# Patient Record
Sex: Male | Born: 1965 | Race: White | Hispanic: No | Marital: Married | Smoking: Former smoker
Health system: Southern US, Community
[De-identification: ages and names within clinical notes are randomized; demographics above are authoritative.]

## PROBLEM LIST (undated history)

## (undated) DIAGNOSIS — K5090 Crohn's disease, unspecified, without complications: Secondary | ICD-10-CM

## (undated) HISTORY — PX: BOWEL RESECTION: SHX1257

## (undated) HISTORY — PX: APPENDECTOMY: SHX54

---

## 1998-01-05 ENCOUNTER — Ambulatory Visit (HOSPITAL_COMMUNITY): Admission: RE | Admit: 1998-01-05 | Discharge: 1998-01-05 | Payer: Self-pay | Admitting: Gastroenterology

## 1998-06-14 ENCOUNTER — Inpatient Hospital Stay (HOSPITAL_COMMUNITY): Admission: EM | Admit: 1998-06-14 | Discharge: 1998-06-24 | Payer: Self-pay | Admitting: Emergency Medicine

## 1998-06-15 ENCOUNTER — Encounter: Payer: Self-pay | Admitting: General Surgery

## 1998-06-17 ENCOUNTER — Encounter: Payer: Self-pay | Admitting: General Surgery

## 1998-06-18 ENCOUNTER — Encounter: Payer: Self-pay | Admitting: Surgery

## 1998-06-26 ENCOUNTER — Inpatient Hospital Stay (HOSPITAL_COMMUNITY): Admission: RE | Admit: 1998-06-26 | Discharge: 1998-07-03 | Payer: Self-pay | Admitting: Surgery

## 1998-06-27 ENCOUNTER — Encounter: Payer: Self-pay | Admitting: Surgery

## 1998-07-02 ENCOUNTER — Encounter: Payer: Self-pay | Admitting: Surgery

## 1999-08-12 ENCOUNTER — Ambulatory Visit (HOSPITAL_COMMUNITY): Admission: RE | Admit: 1999-08-12 | Discharge: 1999-08-12 | Payer: Self-pay | Admitting: Family Medicine

## 1999-08-12 ENCOUNTER — Encounter: Payer: Self-pay | Admitting: Family Medicine

## 2002-04-07 ENCOUNTER — Encounter: Admission: RE | Admit: 2002-04-07 | Discharge: 2002-04-07 | Payer: Self-pay | Admitting: Gastroenterology

## 2002-04-07 ENCOUNTER — Encounter: Payer: Self-pay | Admitting: Gastroenterology

## 2002-11-29 ENCOUNTER — Ambulatory Visit (HOSPITAL_COMMUNITY): Admission: RE | Admit: 2002-11-29 | Discharge: 2002-11-29 | Payer: Self-pay | Admitting: Gastroenterology

## 2005-11-19 ENCOUNTER — Encounter: Payer: Self-pay | Admitting: Emergency Medicine

## 2005-11-19 ENCOUNTER — Encounter: Payer: Self-pay | Admitting: Surgery

## 2011-05-22 ENCOUNTER — Other Ambulatory Visit: Payer: Self-pay | Admitting: Gastroenterology

## 2011-05-22 DIAGNOSIS — K5090 Crohn's disease, unspecified, without complications: Secondary | ICD-10-CM

## 2011-05-23 ENCOUNTER — Ambulatory Visit
Admission: RE | Admit: 2011-05-23 | Discharge: 2011-05-23 | Disposition: A | Discharge status: Home / Self Care | Payer: BC Managed Care – PPO | Source: Ambulatory Visit | Attending: Gastroenterology | Admitting: Gastroenterology

## 2011-05-23 DIAGNOSIS — K5090 Crohn's disease, unspecified, without complications: Secondary | ICD-10-CM

## 2011-05-23 MED ORDER — IOHEXOL 300 MG/ML  SOLN
100.0000 mL | Freq: Once | INTRAMUSCULAR | Status: AC | PRN
  Administered 2011-05-23: 100 mL via INTRAVENOUS

## 2013-02-03 ENCOUNTER — Ambulatory Visit
Admission: RE | Admit: 2013-02-03 | Discharge: 2013-02-03 | Disposition: A | Discharge status: Home / Self Care | Payer: BC Managed Care – PPO | Source: Ambulatory Visit | Attending: Gastroenterology | Admitting: Gastroenterology

## 2013-02-03 ENCOUNTER — Other Ambulatory Visit: Payer: Self-pay | Admitting: Gastroenterology

## 2013-02-03 DIAGNOSIS — M545 Low back pain: Secondary | ICD-10-CM

## 2013-02-03 DIAGNOSIS — R0789 Other chest pain: Secondary | ICD-10-CM

## 2013-02-23 ENCOUNTER — Other Ambulatory Visit: Payer: Self-pay | Admitting: Gastroenterology

## 2013-03-16 ENCOUNTER — Other Ambulatory Visit: Payer: Self-pay

## 2014-03-22 IMAGING — CR DG CHEST 2V
2 series · 2 of 2 positions shown · non-contrast
Comparison: None.

CLINICAL DATA: Chest wall pain, smoking history

CHEST - 2 VIEW

[w chest pa]
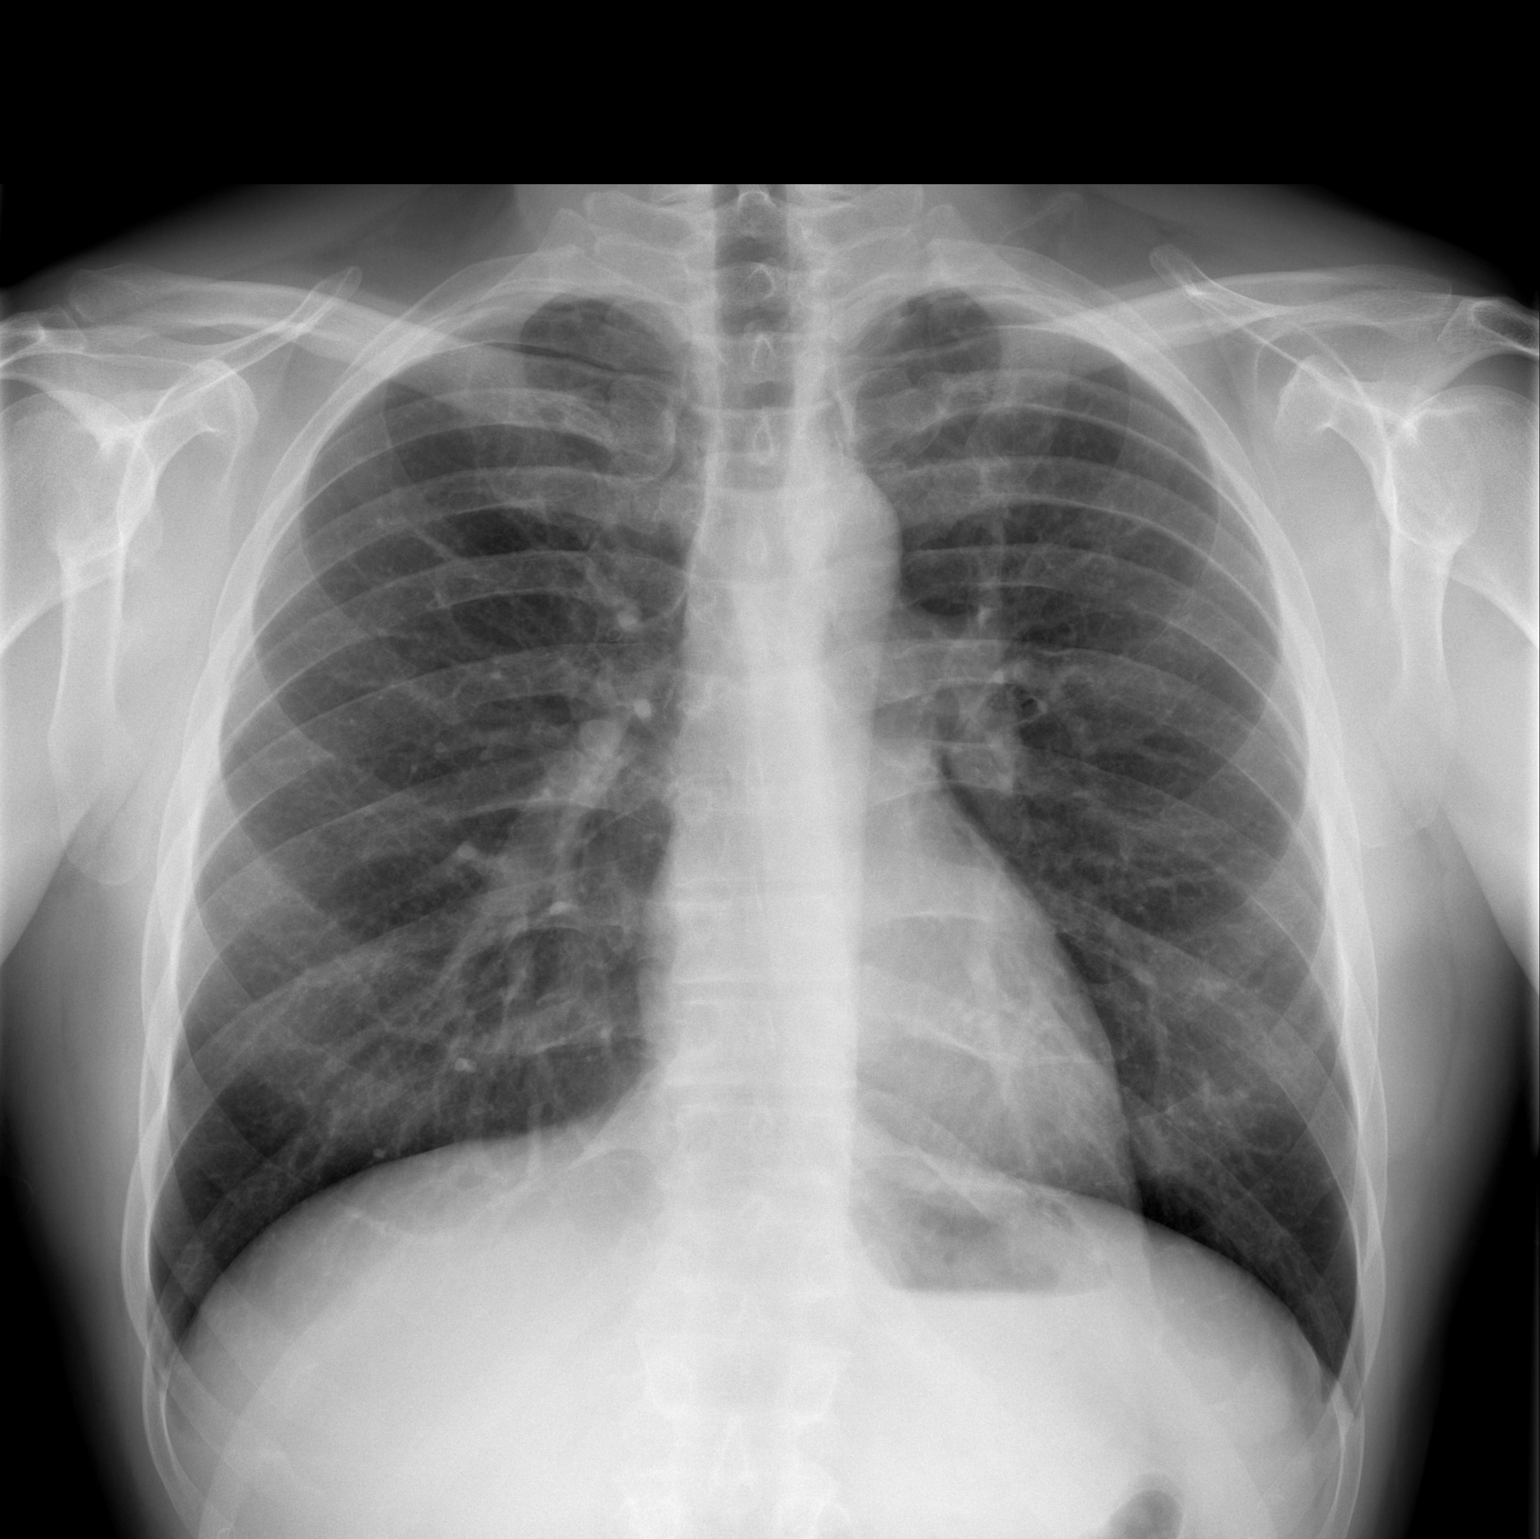

[w chest lat]
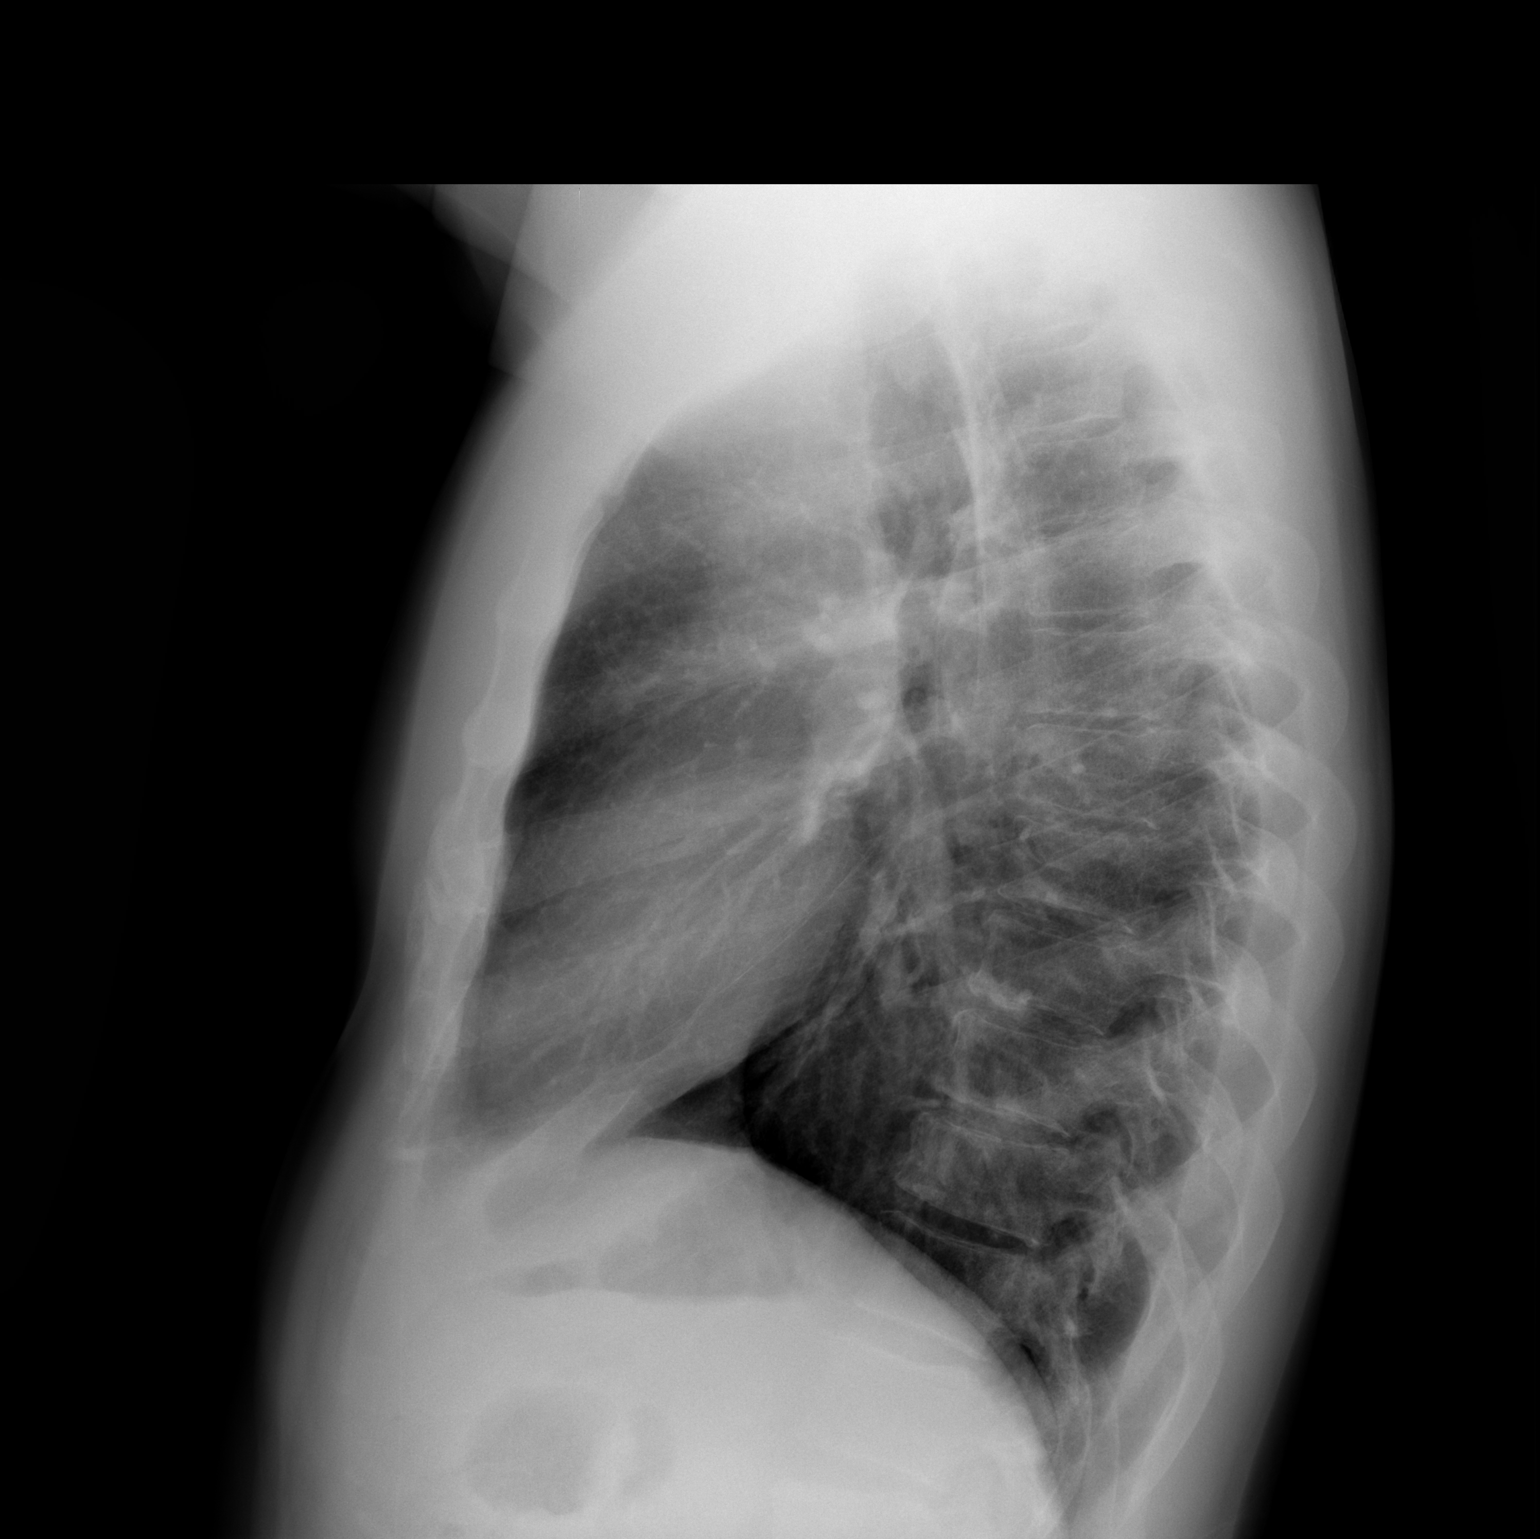

[2 of 2 positions shown; findings below may reference images not displayed]

FINDINGS: No active infiltrate or effusion is seen.  Mediastinal
contours appear normal.  The heart is within normal limits in size.
No bony abnormality is seen.
IMPRESSION: No active lung disease.

## 2014-10-24 ENCOUNTER — Telehealth: Payer: Self-pay | Admitting: Internal Medicine

## 2014-10-25 NOTE — Telephone Encounter (Signed)
Dr Rhea Belton has reviewed extensive records from Dr Randa Evens [facility]). Per Dr Rhea Belton, "Patient with long established care with Dr Randa Evens (16+ years management of crohns). If disease requires escalation of therapy that is beyond what Dr Randa Evens can offer, I would recommend referral to tertiary care GI clinic for management."

## 2014-12-15 ENCOUNTER — Encounter: Payer: Self-pay | Admitting: Family Medicine

## 2014-12-15 ENCOUNTER — Ambulatory Visit (INDEPENDENT_AMBULATORY_CARE_PROVIDER_SITE_OTHER): Payer: BLUE CROSS/BLUE SHIELD | Admitting: Family Medicine

## 2014-12-15 VITALS — BP 110/80 | HR 64 | Temp 98.2°F | Ht 72.0 in | Wt 194.0 lb

## 2014-12-15 DIAGNOSIS — Z23 Encounter for immunization: Secondary | ICD-10-CM

## 2014-12-15 DIAGNOSIS — K5090 Crohn's disease, unspecified, without complications: Secondary | ICD-10-CM | POA: Insufficient documentation

## 2014-12-15 DIAGNOSIS — F909 Attention-deficit hyperactivity disorder, unspecified type: Secondary | ICD-10-CM

## 2014-12-15 DIAGNOSIS — K50919 Crohn's disease, unspecified, with unspecified complications: Secondary | ICD-10-CM

## 2014-12-15 DIAGNOSIS — Z0000 Encounter for general adult medical examination without abnormal findings: Secondary | ICD-10-CM | POA: Diagnosis not present

## 2014-12-15 NOTE — Addendum Note (Signed)
Addended by: Kern Reap B on: 12/15/2014 03:54 PM   Modules accepted: Orders

## 2014-12-15 NOTE — Progress Notes (Signed)
Pre visit review using our clinic review tool, if applicable. No additional management support is needed unless otherwise documented below in the visit note. 

## 2014-12-15 NOTE — Progress Notes (Signed)
HPI:  Victor Lopez is here to establish care. Has not had a family doctor in the past. He sees his gastroenterologist for his Crohn's disease. He has always been healthy otherwise. He felt like it was time to get a family physician. He wants his Tdap today. He sees another doctor for his ADHD.  ROS negative for unless reported above: fevers, unintentional weight loss, hearing or vision loss, chest pain, palpitations, struggling to breath, hemoptysis, melena, hematochezia, hematuria, falls, loc, si, thoughts of self harm  No past medical history on file.  No past surgical history on file.  No family history on file.  History   Social History  . Marital Status: Married    Spouse Name: N/A  . Number of Children: N/A  . Years of Education: N/A   Social History Main Topics  . Smoking status: Former Games developer  . Smokeless tobacco: Not on file  . Alcohol Use: Not on file  . Drug Use: Not on file  . Sexual Activity: Not on file   Other Topics Concern  . None   Social History Narrative  . None     Current outpatient prescriptions:  .  amphetamine-dextroamphetamine (ADDERALL XR) 20 MG 24 hr capsule, Take 20 mg by mouth daily., Disp: , Rfl:  .  budesonide (ENTOCORT EC) 3 MG 24 hr capsule, 3 mg. 3 caps once daily, Disp: , Rfl:  .  mesalamine (PENTASA) 500 MG CR capsule, 500 mg. 2 caps twice daily, Disp: , Rfl:  .  Multiple Vitamin (MULTIVITAMIN) tablet, Take 1 tablet by mouth daily., Disp: , Rfl:  .  Omega 3 1000 MG CAPS, Take 1 capsule by mouth daily., Disp: , Rfl:   EXAM:  Filed Vitals:   12/15/14 1448  BP: 110/80  Pulse: 64  Temp: 98.2 F (36.8 C)    Body mass index is 26.31 kg/(m^2).  GENERAL: vitals reviewed and listed above, alert, oriented, appears well hydrated and in no acute distress  HEENT: atraumatic, conjunttiva clear, no obvious abnormalities on inspection of external nose and ears  NECK: no obvious masses on inspection  LUNGS: clear to auscultation  bilaterally, no wheezes, rales or rhonchi, good air movement  CV: HRRR, no peripheral edema  GU: declined  MS: moves all extremities without noticeable abnormality  PSYCH: pleasant and cooperative, no obvious depression or anxiety  ASSESSMENT AND PLAN:  Discussed the following assessment and plan:  Visit for preventive health examination - Plan: Lipid Panel, Hemoglobin A1c, HIV antibody (with reflex)  Crohn disease, unspecified complication  Attention deficit hyperactivity disorder (ADHD), unspecified ADHD type  -We reviewed the PMH, PSH, FH, SH, Meds and Allergies. -We provided refills for any medications we will prescribe as needed. -We addressed current concerns per orders and patient instructions. -We have asked for records for pertinent exams, studies, vaccines and notes from previous providers. -We have advised patient to follow up per instructions below. -reviewed leval a an b USPSTF recs -advised FASTING lab work for baseline diabetes and lipid screening -advised healthy lifestyle -Tdap -follow up yearly  -Patient advised to return or notify a doctor immediately if symptoms worsen or persist or new concerns arise.  Patient Instructions  BEFORE YOU LEAVE: -Tdap -schedule fasting lab appointment -follow up yearly  -We have ordered labs or studies at this visit. It can take up to 1-2 weeks for results and processing. We will contact you with instructions IF your results are abnormal. Normal results will be released to your [facility]. If  you have not heard from [place] or can not find your results in [facility] in 2 weeks please contact our office.  We recommend the following healthy lifestyle measures: - eat a healthy diet consisting of lots of vegetables, fruits, beans, nuts, seeds, healthy meats such as white chicken and fish   - avoid sweets, starches, fried foods, fast food, processed foods, sodas, red meet and other fattening foods.  - get a least 150 minutes of aerobic  exercise per week.           Kriste Basque R.

## 2014-12-15 NOTE — Patient Instructions (Addendum)
BEFORE YOU LEAVE: -Tdap -schedule fasting lab appointment -follow up yearly  -We have ordered labs or studies at this visit. It can take up to 1-2 weeks for results and processing. We will contact you with instructions IF your results are abnormal. Normal results will be released to your [facility]. If you have not heard from [place] or can not find your results in [facility] in 2 weeks please contact our office.  We recommend the following healthy lifestyle measures: - eat a healthy diet consisting of lots of vegetables, fruits, beans, nuts, seeds, healthy meats such as white chicken and fish   - avoid sweets, starches, fried foods, fast food, processed foods, sodas, red meet and other fattening foods.  - get a least 150 minutes of aerobic exercise per week.

## 2014-12-22 ENCOUNTER — Other Ambulatory Visit (INDEPENDENT_AMBULATORY_CARE_PROVIDER_SITE_OTHER): Payer: BLUE CROSS/BLUE SHIELD

## 2014-12-22 DIAGNOSIS — Z0000 Encounter for general adult medical examination without abnormal findings: Secondary | ICD-10-CM

## 2014-12-22 LAB — LIPID PANEL
CHOL/HDL RATIO: 3
Cholesterol: 160 mg/dL (ref 0–200)
HDL: 51.2 mg/dL (ref >39.00)
LDL Cholesterol: 90 mg/dL (ref 0–99)
NONHDL: 108.8
TRIGLYCERIDES: 92 mg/dL (ref 0.0–149.0)
VLDL: 18.4 mg/dL (ref 0.0–40.0)

## 2014-12-22 LAB — HEMOGLOBIN A1C: HEMOGLOBIN A1C: 5.5 % (ref 4.6–6.5)

## 2014-12-23 LAB — HIV ANTIBODY (ROUTINE TESTING W REFLEX): HIV: NONREACTIVE

## 2015-01-08 ENCOUNTER — Encounter: Payer: BLUE CROSS/BLUE SHIELD | Admitting: Family Medicine

## 2016-05-29 ENCOUNTER — Telehealth: Payer: Self-pay | Admitting: Family Medicine

## 2016-05-29 NOTE — Telephone Encounter (Signed)
Pt is now 50 and would like to have PSA blood work done only. Can I sch?

## 2016-05-29 NOTE — Telephone Encounter (Signed)
Needs physical to discuss and review other recommended preventive care measures. Please help him to schedule. Thanks.

## 2016-05-29 NOTE — Telephone Encounter (Signed)
Pt has been sch for tommorrow °

## 2016-05-30 ENCOUNTER — Encounter: Payer: Self-pay | Admitting: Family Medicine

## 2016-05-30 ENCOUNTER — Ambulatory Visit (INDEPENDENT_AMBULATORY_CARE_PROVIDER_SITE_OTHER): Payer: BLUE CROSS/BLUE SHIELD | Admitting: Family Medicine

## 2016-05-30 VITALS — BP 102/72 | HR 68 | Temp 98.3°F | Ht 72.0 in | Wt 211.0 lb

## 2016-05-30 DIAGNOSIS — F909 Attention-deficit hyperactivity disorder, unspecified type: Secondary | ICD-10-CM | POA: Diagnosis not present

## 2016-05-30 DIAGNOSIS — R351 Nocturia: Secondary | ICD-10-CM

## 2016-05-30 DIAGNOSIS — R3911 Hesitancy of micturition: Secondary | ICD-10-CM | POA: Diagnosis not present

## 2016-05-30 DIAGNOSIS — K50919 Crohn's disease, unspecified, with unspecified complications: Secondary | ICD-10-CM

## 2016-05-30 DIAGNOSIS — Z0000 Encounter for general adult medical examination without abnormal findings: Secondary | ICD-10-CM

## 2016-05-30 LAB — LIPID PANEL
CHOLESTEROL: 162 mg/dL (ref 0–200)
HDL: 49.6 mg/dL (ref >39.00)
LDL CALC: 80 mg/dL (ref 0–99)
NonHDL: 112.08
TRIGLYCERIDES: 162 mg/dL — AB (ref 0.0–149.0)
Total CHOL/HDL Ratio: 3
VLDL: 32.4 mg/dL (ref 0.0–40.0)

## 2016-05-30 LAB — BASIC METABOLIC PANEL
BUN: 13 mg/dL (ref 6–23)
CALCIUM: 9.9 mg/dL (ref 8.4–10.5)
CHLORIDE: 101 meq/L (ref 96–112)
CO2: 29 meq/L (ref 19–32)
Creatinine, Ser: 0.93 mg/dL (ref 0.40–1.50)
GFR: 91.17 mL/min (ref >60.00)
GLUCOSE: 89 mg/dL (ref 70–99)
Potassium: 4.6 mEq/L (ref 3.5–5.1)
SODIUM: 139 meq/L (ref 135–145)

## 2016-05-30 LAB — POCT URINALYSIS DIPSTICK
Bilirubin, UA: NEGATIVE
Blood, UA: NEGATIVE
GLUCOSE UA: NEGATIVE
Ketones, UA: NEGATIVE
LEUKOCYTES UA: NEGATIVE
NITRITE UA: NEGATIVE
Protein, UA: NEGATIVE
SPEC GRAV UA: 1.02
UROBILINOGEN UA: 0.2
pH, UA: 6

## 2016-05-30 LAB — PSA: PSA: 1.31 ng/mL (ref 0.10–4.00)

## 2016-05-30 LAB — HEMOGLOBIN A1C: Hgb A1c MFr Bld: 5.4 % (ref 4.6–6.5)

## 2016-05-30 NOTE — Patient Instructions (Signed)
BEFORE YOU LEAVE: -follow up: yearly and as needed -labs  -We placed a referral for you as discussed to the urologist. It usually takes about 1-2 weeks to process and schedule this referral. If you have not heard from [place] regarding this appointment in 2 weeks please contact our office.  We have ordered labs or studies at this visit. It can take up to 1-2 weeks for results and processing. IF results require follow up or explanation, we will call you with instructions. Clinically stable results will be released to your [facility]. If you have not heard from [place] or cannot find your results in [facility] in 2 weeks please contact our office at [phone].  If you are not yet signed up for [facility], please consider signing up.   We recommend the following healthy lifestyle for LIFE: 1) Small portions.   Tip: eat off of a salad plate instead of a dinner plate.  Tip: if you need more or a snack choose fruits, veggies and/or a handful of nuts or seeds.  2) Eat a healthy clean diet.  * Tip: Avoid (less then 1 serving per week): processed foods, sweets, sweetened drinks, white starches (rice, flour, bread, potatoes, pasta, etc), red meat, fast foods, butter  *Tip: CHOOSE instead   * 5-9 servings per day of fresh or frozen fruits and vegetables (but not corn, potatoes, bananas, canned or dried fruit)   *nuts and seeds, beans   *olives and olive oil   *small portions of lean meats such as fish and white chicken    *small portions of whole grains  3)Get at least 150 minutes of sweaty aerobic exercise per week.  4)Reduce stress - consider counseling, meditation and relaxation to balance other aspects of your life.  If you are not yet signed up for [facility], please SIGN UP TODAY. We now offer online scheduling, same day appointments and extended hours. WHEN YOU DON'T FEEL YOUR BEST.Marland KitchenMarland KitchenWE ARE HERE TO HELP.

## 2016-05-30 NOTE — Progress Notes (Signed)
Pre visit review using our clinic review tool, if applicable. No additional management support is needed unless otherwise documented below in the visit note. 

## 2016-05-30 NOTE — Progress Notes (Signed)
HPI:  Here for CPE:  -Concerns and/or follow up today: Sees specialists for his ADHD and his crohn's disease. In a study, inject several colonoscopies per year. He wants to do a PSA, as a friend of his was diagnosed prostate cancer age 50. He is very worried about this. He has had rapidly progressive changes in his urinary system over the last 3 years. He reports he has developed some urgency and hesitancy along with nocturia 3-5 times per night. He is wondering if he should see a urologist also. Denies hematuria, unexplained weight loss, fevers, pain with urination. -Diet: variety of foods, balance and well rounded, larger portion sizes  -Exercise: no regular exercise  -Diabetes and Dyslipidemia Screening: wants to check labs today as he has gained weight due to poor lifestyle  -Hx of HTN: no  -Vaccines: Declined, he is not a fan of medications are vaccines  -sexual activity: yes, male partner, no new partners  -wants STI testing, Hep C screening (if born 17-1965): no  -FH colon or prstate ca: see FH Last colon cancer screening: sees gi at [facility], frequent, multiple this year Last prostate ca screening: not done  -Alcohol, Tobacco, drug use: see social history  Review of Systems - no fevers, unintentional weight loss, vision loss, hearing loss, chest pain, sob, hemoptysis, melena, hematochezia, hematuria, genital discharge, changing or concerning skin lesions, bleeding, bruising, loc, thoughts of self harm or SI  No past medical history on file.  No past surgical history on file.  No family history on file.  Social History   Social History  . Marital status: Married    Spouse name: N/A  . Number of children: N/A  . Years of education: N/A   Social History Main Topics  . Smoking status: Former Games developer  . Smokeless tobacco: None  . Alcohol use None  . Drug use: Unknown  . Sexual activity: Not Asked   Other Topics Concern  . None   Social History Narrative  .  None     Current Outpatient Prescriptions:  .  amphetamine-dextroamphetamine (ADDERALL XR) 20 MG 24 hr capsule, Take 20 mg by mouth daily., Disp: , Rfl:  .  Multiple Vitamin (MULTIVITAMIN) tablet, Take 1 tablet by mouth daily., Disp: , Rfl:  .  Omega 3 1000 MG CAPS, Take 1 capsule by mouth daily., Disp: , Rfl:  .  Vedolizumab (ENTYVIO IV), Inject into the vein., Disp: , Rfl:   EXAM:  Vitals:   05/30/16 1138  BP: 102/72  Pulse: 68  Temp: 98.3 F (36.8 C)  TempSrc: Oral  Weight: 211 lb (95.7 kg)  Height: 6' (1.829 m)    Estimated body mass index is 28.62 kg/m as calculated from the following:   Height as of this encounter: 6' (1.829 m).   Weight as of this encounter: 211 lb (95.7 kg).  GENERAL: vitals reviewed and listed below, alert, oriented, appears well hydrated and in no acute distress  HEENT: head atraumatic, PERRLA, normal appearance of eyes, ears, nose and mouth. moist mucus membranes.  NECK: supple, no masses or lymphadenopathy  LUNGS: clear to auscultation bilaterally, no rales, rhonchi or wheeze  CV: HRRR, no peripheral edema or cyanosis, normal pedal pulses  ABDOMEN: bowel sounds normal, soft, non tender to palpation, no masses, no rebound or guarding  GU: declined, prefers to see urologist  SKIN: no rash or abnormal lesions - sees dermatologist  MS: normal gait, moves all extremities normally  NEURO: normal gait, speech and thought processing grossly  intact, muscle tone grossly intact throughout  PSYCH: normal affect, pleasant and cooperative  ASSESSMENT AND PLAN:  Discussed the following assessment and plan:  Encounter for preventive health examination - Plan: Lipid Panel, Hemoglobin A1c, Basic metabolic panel  Crohn's disease with complication, unspecified gastrointestinal tract location [facility](HCC)  Attention deficit hyperactivity disorder (ADHD), unspecified ADHD type  Nocturia - Plan: Ambulatory referral to Urology  Urinary hesitancy - Plan:  PSA, POC Urinalysis Dipstick, Ambulatory referral to Urology   -discussed risks/limitations/benefits PSA testing and DRE - will check this and labs/urine today. He still wants to see urologist as well and referral placed. Deferred DRE.  -Discussed and advised all [place] preventive services health task force level A and B recommendations for age, sex and risks.  --Advised at least 150 minutes of exercise per week and a healthy diet with avoidance of (less then 1 serving per week) processed foods, white starches, red meat, fast foods and sweets and consisting of: * 5-9 servings of fresh fruits and vegetables (not corn or potatoes) *nuts and seeds, beans *olives and olive oil *lean meats such as fish and white chicken  *whole grains  -labs, studies and vaccines per orders this encounter   Patient advised to return to clinic immediately if symptoms worsen or persist or new concerns.  There are no Patient Instructions on file for this visit.  No Follow-up on file.   [name], [name], DO

## 2018-05-19 ENCOUNTER — Other Ambulatory Visit: Payer: Self-pay | Admitting: General Surgery

## 2018-05-19 DIAGNOSIS — R1032 Left lower quadrant pain: Principal | ICD-10-CM

## 2018-05-19 DIAGNOSIS — R1031 Right lower quadrant pain: Secondary | ICD-10-CM

## 2018-05-24 ENCOUNTER — Ambulatory Visit
Admission: RE | Admit: 2018-05-24 | Discharge: 2018-05-24 | Disposition: A | Discharge status: Home / Self Care | Payer: BLUE CROSS/BLUE SHIELD | Source: Ambulatory Visit | Attending: General Surgery | Admitting: General Surgery

## 2018-05-24 ENCOUNTER — Encounter: Payer: Self-pay | Admitting: Radiology

## 2018-05-24 DIAGNOSIS — R1032 Left lower quadrant pain: Principal | ICD-10-CM

## 2018-05-24 DIAGNOSIS — R1031 Right lower quadrant pain: Secondary | ICD-10-CM

## 2018-05-24 MED ORDER — IOPAMIDOL (ISOVUE-300) INJECTION 61%
100.0000 mL | Freq: Once | INTRAVENOUS | Status: AC | PRN
  Administered 2018-05-24: 100 mL via INTRAVENOUS

## 2023-09-15 ENCOUNTER — Other Ambulatory Visit: Payer: Self-pay | Admitting: Family Medicine

## 2023-09-15 DIAGNOSIS — T1490XA Injury, unspecified, initial encounter: Secondary | ICD-10-CM

## 2023-09-16 ENCOUNTER — Ambulatory Visit
Admission: RE | Admit: 2023-09-16 | Discharge: 2023-09-16 | Disposition: A | Discharge status: Home / Self Care | Payer: BLUE CROSS/BLUE SHIELD | Source: Ambulatory Visit | Attending: Family Medicine

## 2023-09-16 DIAGNOSIS — T1490XA Injury, unspecified, initial encounter: Secondary | ICD-10-CM

## 2023-09-24 ENCOUNTER — Other Ambulatory Visit: Payer: Self-pay

## 2023-09-24 ENCOUNTER — Encounter (HOSPITAL_COMMUNITY): Payer: Self-pay | Admitting: Orthopedic Surgery

## 2023-09-24 NOTE — Progress Notes (Signed)
 SDW CALL  Patient was given pre-op instructions over the phone. The opportunity was given for the patient to ask questions. No further questions asked. Patient verbalized understanding of instructions given.   PCP - Kathaleen Bury Cardiologist - denies Gastroenterologist - Jaci Carrel  PPM/ICD - denies Device Orders -  Rep Notified -   Chest x-ray - na EKG - na Stress Test - denies ECHO - denies Cardiac Cath - denies  Sleep Study - denies CPAP -   Fasting Blood Sugar - na Checks Blood Sugar ____ times a day  Blood Thinner Instructions:na Aspirin Instructions:na  ERAS Protcol -NO  PRE-SURGERY Ensure or G2-   COVID TEST- na   Anesthesia review: no  Patient denies shortness of breath, fever, cough and chest pain over the phone call    Surgical Instructions    Your procedure is scheduled on March 7  Report to [facility] Main Entrance "A" at 0530 A.M., then check in with the Admitting office.  Call this number if you have problems the morning of surgery:  [phone]    Remember:  Do not eat or drink anything after midnight the night before your surgery   Take these medicines the morning of surgery with A SIP OF WATER: NONE  As of today, STOP taking any Aspirin (unless otherwise instructed by your surgeon) Aleve, Naproxen, Ibuprofen, Motrin, Advil, Goody's, BC's, all herbal medications, fish oil, and all vitamins.  [place] is not responsible for any belongings or valuables. .   Do NOT Smoke (Tobacco/Vaping)  24 hours prior to your procedure  If you use a CPAP at night, you may bring your mask for your overnight stay.   Contacts, glasses, hearing aids, dentures or partials may not be worn into surgery, please bring cases for these belongings   Patients discharged the day of surgery will not be allowed to drive home, and someone needs to stay with them for 24 hours.    Special instructions:    Oral Hygiene is also important to reduce  your risk of infection.  Remember - BRUSH YOUR TEETH THE MORNING OF SURGERY WITH YOUR REGULAR TOOTHPASTE   Day of Surgery:  Take a shower the day of or night before with antibacterial soap. Wear Clean/Comfortable clothing the morning of surgery Do not apply any deodorants/lotions.   Do not wear jewelry or makeup Do not wear lotions, powders, perfumes/colognes, or deodorant. Do not shave 48 hours prior to surgery.  Men may shave face and neck. Do not bring valuables to the hospital. Do not wear nail polish, gel polish, artificial nails, or any other type of covering on natural nails (fingers and toes) If you have artificial nails or gel coating that need to be removed by a nail salon, please have this removed prior to surgery. Artificial nails or gel coating may interfere with anesthesia's ability to adequately monitor your vital signs. Remember to brush your teeth WITH YOUR REGULAR TOOTHPASTE.

## 2023-09-24 NOTE — Anesthesia Preprocedure Evaluation (Addendum)
 Anesthesia Evaluation  Patient identified by MRN, date of birth, ID band Patient awake    Reviewed: Allergy & Precautions, H&P , NPO status , Patient's Chart, lab work & pertinent test results  Airway Mallampati: III  TM Distance: >3 FB Neck ROM: Full    Dental no notable dental hx. (+) Teeth Intact, Dental Advisory Given   Pulmonary neg pulmonary ROS, former smoker   Pulmonary exam normal breath sounds clear to auscultation       Cardiovascular Exercise Tolerance: Good negative cardio ROS  Rhythm:Regular Rate:Normal     Neuro/Psych negative neurological ROS  negative psych ROS   GI/Hepatic negative GI ROS, Neg liver ROS,,,  Endo/Other  negative endocrine ROS    Renal/GU negative Renal ROS  negative genitourinary   Musculoskeletal   Abdominal   Peds  Hematology negative hematology ROS (+)   Anesthesia Other Findings   Reproductive/Obstetrics negative OB ROS                             Anesthesia Physical Anesthesia Plan  ASA: 2  Anesthesia Plan: General   Post-op Pain Management: Regional block* and Tylenol PO (pre-op)*   Induction: Intravenous  PONV Risk Score and Plan: 2 and Midazolam, Dexamethasone and Ondansetron  Airway Management Planned: LMA  Additional Equipment:   Intra-op Plan:   Post-operative Plan: Extubation in OR  Informed Consent: I have reviewed the patients History and Physical, chart, labs and discussed the procedure including the risks, benefits and alternatives for the proposed anesthesia with the patient or authorized representative who has indicated his/her understanding and acceptance.     Dental advisory given  Plan Discussed with: CRNA  Anesthesia Plan Comments:        Anesthesia Quick Evaluation

## 2023-09-25 ENCOUNTER — Encounter (HOSPITAL_COMMUNITY): Payer: Self-pay | Admitting: Orthopedic Surgery

## 2023-09-25 ENCOUNTER — Encounter (HOSPITAL_COMMUNITY)
Admission: RE | Disposition: A | Discharge status: Home / Self Care | Payer: Self-pay | Source: Home / Self Care | Attending: Orthopedic Surgery

## 2023-09-25 ENCOUNTER — Ambulatory Visit (HOSPITAL_COMMUNITY): Payer: Self-pay | Admitting: Anesthesiology

## 2023-09-25 ENCOUNTER — Other Ambulatory Visit: Payer: Self-pay

## 2023-09-25 ENCOUNTER — Ambulatory Visit (HOSPITAL_COMMUNITY)
Admission: RE | Admit: 2023-09-25 | Discharge: 2023-09-25 | Disposition: A | Discharge status: Home / Self Care | Attending: Orthopedic Surgery | Admitting: Orthopedic Surgery

## 2023-09-25 ENCOUNTER — Other Ambulatory Visit (HOSPITAL_COMMUNITY): Payer: Self-pay

## 2023-09-25 DIAGNOSIS — K5090 Crohn's disease, unspecified, without complications: Secondary | ICD-10-CM | POA: Diagnosis not present

## 2023-09-25 DIAGNOSIS — Z87891 Personal history of nicotine dependence: Secondary | ICD-10-CM | POA: Insufficient documentation

## 2023-09-25 DIAGNOSIS — Y9323 Activity, snow (alpine) (downhill) skiing, snow boarding, sledding, tobogganing and snow tubing: Secondary | ICD-10-CM | POA: Diagnosis not present

## 2023-09-25 DIAGNOSIS — S86012A Strain of left Achilles tendon, initial encounter: Secondary | ICD-10-CM | POA: Diagnosis present

## 2023-09-25 HISTORY — PX: ACHILLES TENDON SURGERY: SHX542

## 2023-09-25 HISTORY — DX: Crohn's disease, unspecified, without complications: K50.90

## 2023-09-25 LAB — CBC
HCT: 41.9 % (ref 39.0–52.0)
Hemoglobin: 14.4 g/dL (ref 13.0–17.0)
MCH: 30.9 pg (ref 26.0–34.0)
MCHC: 34.4 g/dL (ref 30.0–36.0)
MCV: 89.9 fL (ref 80.0–100.0)
Platelets: 220 10*3/uL (ref 150–400)
RBC: 4.66 MIL/uL (ref 4.22–5.81)
RDW: 13.2 % (ref 11.5–15.5)
WBC: 6.1 10*3/uL (ref 4.0–10.5)
nRBC: 0 % (ref 0.0–0.2)

## 2023-09-25 LAB — SURGICAL PCR SCREEN
MRSA, PCR: POSITIVE — AB
Staphylococcus aureus: POSITIVE — AB

## 2023-09-25 SURGERY — REPAIR, TENDON, ACHILLES
Anesthesia: General | Site: Ankle | Laterality: Left

## 2023-09-25 MED ORDER — ONDANSETRON HCL 4 MG/2ML IJ SOLN
INTRAMUSCULAR | Status: AC
  Filled 2023-09-25: qty 2

## 2023-09-25 MED ORDER — FENTANYL CITRATE (PF) 100 MCG/2ML IJ SOLN
INTRAMUSCULAR | Status: AC
  Filled 2023-09-25: qty 2

## 2023-09-25 MED ORDER — BUPIVACAINE-EPINEPHRINE (PF) 0.5% -1:200000 IJ SOLN
INTRAMUSCULAR | Status: DC | PRN
  Administered 2023-09-25: 15 mL via PERINEURAL
  Administered 2023-09-25: 20 mL via PERINEURAL

## 2023-09-25 MED ORDER — PROPOFOL 10 MG/ML IV BOLUS
INTRAVENOUS | Status: DC | PRN
  Administered 2023-09-25: 140 mg via INTRAVENOUS

## 2023-09-25 MED ORDER — ACETAMINOPHEN 10 MG/ML IV SOLN
INTRAVENOUS | Status: AC
  Filled 2023-09-25: qty 100

## 2023-09-25 MED ORDER — DEXAMETHASONE SODIUM PHOSPHATE 10 MG/ML IJ SOLN
INTRAMUSCULAR | Status: AC
  Filled 2023-09-25: qty 1

## 2023-09-25 MED ORDER — ROCURONIUM BROMIDE 10 MG/ML (PF) SYRINGE
PREFILLED_SYRINGE | INTRAVENOUS | Status: AC
  Filled 2023-09-25: qty 10

## 2023-09-25 MED ORDER — ROCURONIUM 10MG/ML (10ML) SYRINGE FOR MEDFUSION PUMP - OPTIME
INTRAVENOUS | Status: DC | PRN
  Administered 2023-09-25 (×2): 20 mg via INTRAVENOUS
  Administered 2023-09-25: 50 mg via INTRAVENOUS

## 2023-09-25 MED ORDER — VANCOMYCIN HCL IN DEXTROSE 1-5 GM/200ML-% IV SOLN
INTRAVENOUS | Status: AC
  Filled 2023-09-25: qty 200

## 2023-09-25 MED ORDER — ACETAMINOPHEN 500 MG PO TABS
500.0000 mg | ORAL_TABLET | Freq: Three times a day (TID) | ORAL | 0 refills | Status: AC | PRN
  Filled 2023-09-25: qty 60, 10d supply, fill #0

## 2023-09-25 MED ORDER — OXYCODONE HCL 5 MG PO TABS
5.0000 mg | ORAL_TABLET | Freq: Three times a day (TID) | ORAL | 0 refills | Status: AC | PRN
  Filled 2023-09-25: qty 30, 5d supply, fill #0

## 2023-09-25 MED ORDER — ACETAMINOPHEN 10 MG/ML IV SOLN
1000.0000 mg | Freq: Four times a day (QID) | INTRAVENOUS | Status: DC
  Administered 2023-09-25: 1000 mg via INTRAVENOUS

## 2023-09-25 MED ORDER — ONDANSETRON HCL 4 MG/2ML IJ SOLN
INTRAMUSCULAR | Status: DC | PRN
  Administered 2023-09-25: 4 mg via INTRAVENOUS

## 2023-09-25 MED ORDER — EPHEDRINE 5 MG/ML INJ
INTRAVENOUS | Status: AC
  Filled 2023-09-25: qty 5

## 2023-09-25 MED ORDER — DEXAMETHASONE SODIUM PHOSPHATE 10 MG/ML IJ SOLN
INTRAMUSCULAR | Status: DC | PRN
Start: 2023-09-25 — End: 2023-09-25
  Administered 2023-09-25: 8 mg via INTRAVENOUS

## 2023-09-25 MED ORDER — MUPIROCIN 2 % EX OINT: 1.0000 | TOPICAL_OINTMENT | Freq: Two times a day (BID) | CUTANEOUS | Status: DC

## 2023-09-25 MED ORDER — ONDANSETRON 4 MG PO TBDP
4.0000 mg | ORAL_TABLET | Freq: Three times a day (TID) | ORAL | 0 refills | Status: AC | PRN
  Filled 2023-09-25: qty 20, 7d supply, fill #0

## 2023-09-25 MED ORDER — CEFAZOLIN SODIUM-DEXTROSE 2-4 GM/100ML-% IV SOLN
2.0000 g | INTRAVENOUS | Status: AC
  Administered 2023-09-25: 2 g via INTRAVENOUS
  Filled 2023-09-25: qty 100

## 2023-09-25 MED ORDER — CHLORHEXIDINE GLUCONATE CLOTH 2 % EX PADS: 6.0000 | MEDICATED_PAD | Freq: Every day | CUTANEOUS | Status: DC

## 2023-09-25 MED ORDER — PROPOFOL 10 MG/ML IV BOLUS
INTRAVENOUS | Status: AC
  Filled 2023-09-25: qty 20

## 2023-09-25 MED ORDER — MIDAZOLAM HCL 2 MG/2ML IJ SOLN
INTRAMUSCULAR | Status: AC
  Filled 2023-09-25: qty 2

## 2023-09-25 MED ORDER — BUPIVACAINE LIPOSOME 1.3 % IJ SUSP
INTRAMUSCULAR | Status: DC | PRN
  Administered 2023-09-25: 10 mL via PERINEURAL

## 2023-09-25 MED ORDER — METHOCARBAMOL 500 MG PO TABS
500.0000 mg | ORAL_TABLET | Freq: Three times a day (TID) | ORAL | 0 refills | Status: AC | PRN
  Filled 2023-09-25: qty 60, 10d supply, fill #0

## 2023-09-25 MED ORDER — LACTATED RINGERS IV SOLN: INTRAVENOUS | Status: DC

## 2023-09-25 MED ORDER — AMISULPRIDE (ANTIEMETIC) 5 MG/2ML IV SOLN
INTRAVENOUS | Status: AC
  Filled 2023-09-25: qty 4

## 2023-09-25 MED ORDER — AMISULPRIDE (ANTIEMETIC) 5 MG/2ML IV SOLN
10.0000 mg | Freq: Once | INTRAVENOUS | Status: AC
Start: 2023-09-25 — End: 2023-09-25
  Administered 2023-09-25: 10 mg via INTRAVENOUS

## 2023-09-25 MED ORDER — EPHEDRINE SULFATE-NACL 50-0.9 MG/10ML-% IV SOSY
PREFILLED_SYRINGE | INTRAVENOUS | Status: DC | PRN
Start: 2023-09-25 — End: 2023-09-25
  Administered 2023-09-25: 10 mg via INTRAVENOUS

## 2023-09-25 MED ORDER — VANCOMYCIN HCL 1000 MG IV SOLR
INTRAVENOUS | Status: DC | PRN
  Administered 2023-09-25: 1000 mg via INTRAVENOUS

## 2023-09-25 MED ORDER — FENTANYL CITRATE (PF) 250 MCG/5ML IJ SOLN
INTRAMUSCULAR | Status: DC | PRN
Start: 2023-09-25 — End: 2023-09-25
  Administered 2023-09-25: 100 ug via INTRAVENOUS

## 2023-09-25 MED ORDER — MIDAZOLAM HCL 2 MG/2ML IJ SOLN
INTRAMUSCULAR | Status: DC | PRN
  Administered 2023-09-25: 2 mg via INTRAVENOUS

## 2023-09-25 MED ORDER — ORAL CARE MOUTH RINSE: 15.0000 mL | Freq: Once | OROMUCOSAL | Status: AC

## 2023-09-25 MED ORDER — CHLORHEXIDINE GLUCONATE 0.12 % MT SOLN
15.0000 mL | Freq: Once | OROMUCOSAL | Status: AC
  Administered 2023-09-25: 15 mL via OROMUCOSAL
  Filled 2023-09-25: qty 15

## 2023-09-25 MED ORDER — SUGAMMADEX SODIUM 200 MG/2ML IV SOLN
INTRAVENOUS | Status: AC
  Filled 2023-09-25: qty 2

## 2023-09-25 SURGICAL SUPPLY — 68 items
BAG COUNTER SPONGE SURGICOUNT (BAG) ×1 IMPLANT
BANDAGE ESMARK 6X9 LF (GAUZE/BANDAGES/DRESSINGS) ×1 IMPLANT
BIT DRILL 7/64X5 DISP (BIT) ×1 IMPLANT
BNDG COHESIVE 4X5 TAN STRL LF (GAUZE/BANDAGES/DRESSINGS) ×1 IMPLANT
BNDG ELASTIC 4INX 5YD STR LF (GAUZE/BANDAGES/DRESSINGS) IMPLANT
BNDG ELASTIC 4X5.8 VLCR STR LF (GAUZE/BANDAGES/DRESSINGS) ×1 IMPLANT
BNDG ELASTIC 6INX 5YD STR LF (GAUZE/BANDAGES/DRESSINGS) ×1 IMPLANT
BNDG ESMARK 6X9 LF (GAUZE/BANDAGES/DRESSINGS) IMPLANT
BNDG GAUZE DERMACEA FLUFF 4 (GAUZE/BANDAGES/DRESSINGS) IMPLANT
COVER MAYO STAND STRL (DRAPES) ×1 IMPLANT
CUFF TOURN SGL QUICK 42 (TOURNIQUET CUFF) IMPLANT
CUFF TRNQT CYL 34X4.125X (TOURNIQUET CUFF) IMPLANT
DRAPE C-ARMOR (DRAPES) ×1 IMPLANT
DRAPE INCISE IOBAN 66X45 STRL (DRAPES) ×1 IMPLANT
DRAPE SURG ORHT 6 SPLT 77X108 (DRAPES) ×3 IMPLANT
DRAPE U-SHAPE 47X51 STRL (DRAPES) ×1 IMPLANT
DRSG MEPITEL 4X7.2 (GAUZE/BANDAGES/DRESSINGS) IMPLANT
DURAPREP 26ML APPLICATOR (WOUND CARE) ×1 IMPLANT
ELECT REM PT RETURN 9FT ADLT (ELECTROSURGICAL) ×1 IMPLANT
ELECTRODE REM PT RTRN 9FT ADLT (ELECTROSURGICAL) ×1 IMPLANT
GAUZE PAD ABD 8X10 STRL (GAUZE/BANDAGES/DRESSINGS) ×1 IMPLANT
GAUZE SPONGE 4X4 12PLY STRL (GAUZE/BANDAGES/DRESSINGS) ×1 IMPLANT
GAUZE XEROFORM 1X8 LF (GAUZE/BANDAGES/DRESSINGS) ×1 IMPLANT
GLOVE BIO SURGEON STRL SZ7.5 (GLOVE) ×1 IMPLANT
GLOVE BIO SURGEON STRL SZ8 (GLOVE) ×1 IMPLANT
GLOVE BIOGEL PI IND STRL 7.5 (GLOVE) ×1 IMPLANT
GLOVE BIOGEL PI IND STRL 8 (GLOVE) ×2 IMPLANT
GLOVE SURG ORTHO LTX SZ7.5 (GLOVE) ×2 IMPLANT
GOWN STRL REUS W/ TWL LRG LVL3 (GOWN DISPOSABLE) ×2 IMPLANT
GOWN STRL REUS W/ TWL XL LVL3 (GOWN DISPOSABLE) ×1 IMPLANT
GOWN STRL REUS W/TWL 2XL LVL3 (GOWN DISPOSABLE) ×1 IMPLANT
IMMOBILIZER KNEE 22 UNIV (SOFTGOODS) IMPLANT
KIT BASIN OR (CUSTOM PROCEDURE TRAY) ×1 IMPLANT
KIT TURNOVER KIT B (KITS) ×1 IMPLANT
MANIFOLD NEPTUNE II (INSTRUMENTS) ×1 IMPLANT
NDL 22X1.5 STRL (OR ONLY) (MISCELLANEOUS) IMPLANT
NDL SUT .5 MAYO 1.404X.05X (NEEDLE) IMPLANT
NEEDLE 22X1.5 STRL (OR ONLY) (MISCELLANEOUS) IMPLANT
NS IRRIG 1000ML POUR BTL (IV SOLUTION) ×1 IMPLANT
PACK ORTHO EXTREMITY (CUSTOM PROCEDURE TRAY) ×1 IMPLANT
PAD ARMBOARD 7.5X6 YLW CONV (MISCELLANEOUS) ×2 IMPLANT
PAD CAST 4YDX4 CTTN HI CHSV (CAST SUPPLIES) ×2 IMPLANT
PADDING CAST COTTON 6X4 STRL (CAST SUPPLIES) IMPLANT
PASSER SUT SWANSON 36MM LOOP (INSTRUMENTS) ×1 IMPLANT
POSITIONER HEAD PRONE TRACH (MISCELLANEOUS) IMPLANT
SPLINT PLASTER CAST FAST 5X30 (CAST SUPPLIES) IMPLANT
SPONGE T-LAP 18X18 ~~LOC~~+RFID (SPONGE) ×1 IMPLANT
STAPLER VISISTAT 35W (STAPLE) ×1 IMPLANT
STOCKINETTE IMPERVIOUS 9X36 MD (GAUZE/BANDAGES/DRESSINGS) ×1 IMPLANT
SUCTION TUBE FRAZIER 10FR DISP (SUCTIONS) IMPLANT
SUT BROADBAND TAPE 2PK 1.5 (SUTURE) IMPLANT
SUT ETHIBOND 2 V 37 (SUTURE) IMPLANT
SUT ETHILON 2 0 FS 18 (SUTURE) IMPLANT
SUT FIBERWIRE #2 38 REV NDL BL (SUTURE) IMPLANT
SUT MNCRL AB 3-0 PS2 18 (SUTURE) IMPLANT
SUT PDS AB 0 CT1 27 (SUTURE) IMPLANT
SUT PDS AB 2-0 CT1 27 (SUTURE) IMPLANT
SUT PDS AB 4-0 P3 18 (SUTURE) IMPLANT
SUT VIC AB 1 CT1 27XBRD ANBCTR (SUTURE) ×1 IMPLANT
SUT VIC AB 2-0 CTB1 (SUTURE) ×1 IMPLANT
SUTURE FIBERWR#2 38 REV NDL BL (SUTURE) IMPLANT
SYR CONTROL 10ML LL (SYRINGE) IMPLANT
TOWEL GREEN STERILE (TOWEL DISPOSABLE) ×1 IMPLANT
TOWEL GREEN STERILE FF (TOWEL DISPOSABLE) ×1 IMPLANT
TUBE CONNECTING 12X1/4 (SUCTIONS) ×1 IMPLANT
UNDERPAD 30X36 HEAVY ABSORB (UNDERPADS AND DIAPERS) ×1 IMPLANT
WATER STERILE IRR 1000ML POUR (IV SOLUTION) ×1 IMPLANT
YANKAUER SUCT BULB TIP NO VENT (SUCTIONS) ×1 IMPLANT

## 2023-09-25 NOTE — Anesthesia Postprocedure Evaluation (Signed)
 Anesthesia Post Note  Patient: Victor Lopez  Procedure(s) Performed: REPAIR, TENDON, ACHILLES (Left: Ankle)     Patient location during evaluation: PACU Anesthesia Type: General and Regional Level of consciousness: awake and alert Pain management: pain level controlled Vital Signs Assessment: post-procedure vital signs reviewed and stable Respiratory status: spontaneous breathing, nonlabored ventilation and respiratory function stable Cardiovascular status: blood pressure returned to baseline and stable Postop Assessment: no apparent nausea or vomiting Anesthetic complications: no  No notable events documented.  Last Vitals:  Vitals:   09/25/23 1045 09/25/23 1100  BP: (!) 127/93 (!) 122/96  Pulse: 71 73  Resp: 17 16  Temp: 36.6 C   SpO2: 96% 97%    Last Pain:  Vitals:   09/25/23 1045  PainSc: 0-No pain                 Shaheim Mahar,W. EDMOND

## 2023-09-25 NOTE — Anesthesia Procedure Notes (Signed)
 Procedure Name: Intubation Date/Time: 09/25/2023 8:23 AM  Performed by: Gus Puma, CRNAPre-anesthesia Checklist: Patient identified, Emergency Drugs available, Suction available and Patient being monitored Patient Re-evaluated:Patient Re-evaluated prior to induction Oxygen Delivery Method: Circle System Utilized Preoxygenation: Pre-oxygenation with 100% oxygen Induction Type: IV induction Ventilation: Mask ventilation without difficulty Laryngoscope Size: Mac and 3 Grade View: Grade II Tube type: Oral Tube size: 7.5 mm Number of attempts: 1 Airway Equipment and Method: Stylet Placement Confirmation: ETT inserted through vocal cords under direct vision, positive ETCO2 and breath sounds checked- equal and bilateral Secured at: 23 cm Tube secured with: Tape Dental Injury: Teeth and Oropharynx as per pre-operative assessment

## 2023-09-25 NOTE — Anesthesia Procedure Notes (Signed)
 Anesthesia Regional Block: Adductor canal block   Pre-Anesthetic Checklist: , timeout performed,  Correct Patient, Correct Site, Correct Laterality,  Correct Procedure, Correct Position, site marked,  Risks and benefits discussed,  Pre-op evaluation,  At surgeon's request and post-op pain management  Laterality: Left  Prep: Maximum Sterile Barrier Precautions used, chloraprep       Needles:  Injection technique: Single-shot  Needle Type: Echogenic Stimulator Needle     Needle Length: 9cm  Needle Gauge: 21     Additional Needles:   Procedures:,,,, ultrasound used (permanent image in chart),,    Narrative:  Start time: 09/25/2023 7:15 AM End time: 09/25/2023 7:20 AM Injection made incrementally with aspirations every 5 mL.  Performed by: Personally  Anesthesiologist: Gaynelle Adu, MD

## 2023-09-25 NOTE — Transfer of Care (Signed)
 Immediate Anesthesia Transfer of Care Note  Patient: Victor Lopez  Procedure(s) Performed: REPAIR, TENDON, ACHILLES (Left: Ankle)  Patient Location: PACU  Anesthesia Type:General  Level of Consciousness: drowsy and patient cooperative  Airway & Oxygen Therapy: Patient Spontanous Breathing and Patient connected to nasal cannula oxygen  Post-op Assessment: Report given to RN and Post -op Vital signs reviewed and stable  Post vital signs: Reviewed and stable  Last Vitals:  Vitals Value Taken Time  BP 122/89 09/25/23 1015  Temp    Pulse 80 09/25/23 1015  Resp 20 09/25/23 1015  SpO2 91 % 09/25/23 1015  Vitals shown include unfiled device data.  Last Pain:  Vitals:   09/25/23 0631  PainSc: 1       Patients Stated Pain Goal: 0 (09/25/23 0631)  Complications: No notable events documented.

## 2023-09-25 NOTE — Discharge Instructions (Signed)
 [facility] Discharge Instructions   General Discharge Instructions   WEIGHT BEARING STATUS: Nonweightbearing left leg   RANGE OF MOTION/ACTIVITY: no ankle motion as you are splinted. Activity as tolerated while maintaining weightbearing restrictions  Bone health:   Review the following resource for additional information regarding bone health  [email or web address]  Wound Care: do not remove splint. Do not get splint wet    Diet: as you were eating previously.  Can use over the counter stool softeners and bowel preparations, such as Miralax, to help with bowel movements.  Narcotics can be constipating.  Be sure to drink plenty of fluids  PAIN MEDICATION USE AND EXPECTATIONS  You have likely been given narcotic medications to help control your pain.  After a traumatic event that results in an fracture (broken bone) with or without surgery, it is ok to use narcotic pain medications to help control one's pain.  We understand that everyone responds to pain differently and each individual patient will be evaluated on a regular basis for the continued need for narcotic medications. Ideally, narcotic medication use should last no more than 6-8 weeks (coinciding with fracture healing).   As a patient it is your responsibility as well to monitor narcotic medication use and report the amount and frequency you use these medications when you come to your office visit.   We would also advise that if you are using narcotic medications, you should take a dose prior to therapy to maximize you participation.  IF YOU ARE ON NARCOTIC MEDICATIONS IT IS NOT PERMISSIBLE TO OPERATE A MOTOR VEHICLE (MOTORCYCLE/CAR/TRUCK/MOPED) OR HEAVY MACHINERY DO NOT MIX NARCOTICS WITH OTHER CNS (CENTRAL NERVOUS SYSTEM) DEPRESSANTS SUCH AS ALCOHOL   POST-OPERATIVE OPIOID TAPER INSTRUCTIONS: It is important to wean off of your opioid medication as soon as possible. If you do not need  pain medication after your surgery it is ok to stop day one. Opioids include: Codeine, Hydrocodone(Norco, Vicodin), Oxycodone(Percocet, oxycontin) and hydromorphone amongst others.  Long term and even short term use of opiods can cause: Increased pain response Dependence Constipation Depression Respiratory depression And more.  Withdrawal symptoms can include Flu like symptoms Nausea, vomiting And more Techniques to manage these symptoms Hydrate well Eat regular healthy meals Stay active Use relaxation techniques(deep breathing, meditating, yoga) Do Not substitute Alcohol to help with tapering If you have been on opioids for less than two weeks and do not have pain than it is ok to stop all together.  Plan to wean off of opioids This plan should start within one week post op of your fracture surgery  Maintain the same interval or time between taking each dose and first decrease the dose.  Cut the total daily intake of opioids by one tablet each day Next start to increase the time between doses. The last dose that should be eliminated is the evening dose.    STOP SMOKING OR USING NICOTINE PRODUCTS!!!!  As discussed nicotine severely impairs your body's ability to heal surgical and traumatic wounds but also impairs bone healing.  Wounds and bone heal by forming microscopic blood vessels (angiogenesis) and nicotine is a vasoconstrictor (essentially, shrinks blood vessels).  Therefore, if vasoconstriction occurs to these microscopic blood vessels they essentially disappear and are unable to deliver necessary nutrients to the healing tissue.  This is one modifiable factor that you can do to dramatically increase your chances of healing your injury.    (This means no smoking, no nicotine gum, patches, etc)  DO NOT USE  NONSTEROIDAL ANTI-INFLAMMATORY DRUGS (NSAID'S)  Using products such as Advil (ibuprofen), Aleve (naproxen), Motrin (ibuprofen) for additional pain control during fracture  healing can delay and/or prevent the healing response.  If you would like to take over the counter (OTC) medication, Tylenol (acetaminophen) is ok.  However, some narcotic medications that are given for pain control contain acetaminophen as well. Therefore, you should not exceed more than 4000 mg of tylenol in a day if you do not have liver disease.  Also note that there are may OTC medicines, such as cold medicines and allergy medicines that my contain tylenol as well.  If you have any questions about medications and/or interactions please ask your doctor/PA or your pharmacist.      ICE AND ELEVATE INJURED/OPERATIVE EXTREMITY  Using ice and elevating the injured extremity above your heart can help with swelling and pain control.  Icing in a pulsatile fashion, such as 20 minutes on and 20 minutes off, can be followed.    Do not place ice directly on skin. Make sure there is a barrier between to skin and the ice pack.    Using frozen items such as frozen peas works well as the conform nicely to the are that needs to be iced.  USE AN ACE WRAP OR TED HOSE FOR SWELLING CONTROL  In addition to icing and elevation, Ace wraps or TED hose are used to help limit and resolve swelling.  It is recommended to use Ace wraps or TED hose until you are informed to stop.    When using Ace Wraps start the wrapping distally (farthest away from the body) and wrap proximally (closer to the body)   Example: If you had surgery on your leg and you do not have a splint on, start the ace wrap at the toes and work your way up to the thigh        If you had surgery on your upper extremity and do not have a splint on, start the ace wrap at your fingers and work your way up to the upper arm  IF YOU ARE IN A SPLINT OR CAST DO NOT REMOVE IT FOR ANY REASON   If your splint gets wet for any reason please contact the office immediately. You may shower in your splint or cast as long as you keep it dry.  This can be done by wrapping in a  cast cover or garbage back (or similar)  Do Not stick any thing down your splint or cast such as pencils, money, or hangers to try and scratch yourself with.  If you feel itchy take benadryl as prescribed on the bottle for itching  IF YOU ARE IN A CAM BOOT (BLACK BOOT)  You may remove boot periodically. Perform daily dressing changes as noted below.  Wash the liner of the boot regularly and wear a sock when wearing the boot. It is recommended that you sleep in the boot until told otherwise    Call office for the following: Temperature greater than 101F Persistent nausea and vomiting Severe uncontrolled pain Redness, tenderness, or signs of infection (pain, swelling, redness, odor or green/yellow discharge around the site) Difficulty breathing, headache or visual disturbances Hives Persistent dizziness or light-headedness Extreme fatigue Any other questions or concerns you may have after discharge  In an emergency, call 911 or go to an Emergency Department at a nearby hospital  HELPFUL INFORMATION  If you had a block, it will wear off between 8-24 hrs postop typically.  This is period when your pain may go from nearly zero to the pain you would have had postop without the block.  This is an abrupt transition but nothing dangerous is happening.  You may take an extra dose of narcotic when this happens.  You should wean off your narcotic medicines as soon as you are able.  Most patients will be off or using minimal narcotics before their first postop appointment.   We suggest you use the pain medication the first night prior to going to bed, in order to ease any pain when the anesthesia wears off. You should avoid taking pain medications on an empty stomach as it will make you nauseous.  Do not drink alcoholic beverages or take illicit drugs when taking pain medications.  In most states it is against the law to drive while you are in a splint or sling.  And certainly against the law to  drive while taking narcotics.  You may return to work/school in the next couple of days when you feel up to it.   Pain medication may make you constipated.  Below are a few solutions to try in this order: Decrease the amount of pain medication if you aren't having pain. Drink lots of decaffeinated fluids. Drink prune juice and/or each dried prunes  If the first 3 don't work start with additional solutions Take Colace - an over-the-counter stool softener Take Senokot - an over-the-counter laxative Take Miralax - a stronger over-the-counter laxative     CALL THE OFFICE WITH ANY QUESTIONS OR CONCERNS: [phone]   VISIT OUR WEBSITE FOR ADDITIONAL INFORMATION: [email or web address]

## 2023-09-25 NOTE — Progress Notes (Signed)
 Transition of Care [facility]) - Emergency Department Mini Assessment   Patient Details  Name: Victor Lopez MRN: [id number] Date of Birth: September 01, 1965  [facility]) CM/SW Contact:    Oletta Cohn, RN Phone Number: 09/25/2023, 10:33 AM   Clinical Narrative: Oletta Cohn, RN, BSN, NCM [phone] Pt qualifies for DME [facility]) platform walker.  DME  ordered through [employer].  Jermaine of [employer] notified to deliver DME to pt room prior to D/C home.    ED Mini Assessment: What brought you to the Emergency Department? : (P) surgery  Barriers to Discharge: (P) No Barriers Identified     Means of departure: (P) Car  Interventions which prevented an admission or readmission: (P) Medication Review, DME Provided    Patient Contact and Communications Key Contact 1: (P) spouse      ,                 Admission diagnosis:  Laceration of Achilles tendon, left, initial encounter [Z36.644I] Patient Active Problem List   Diagnosis Date Noted   Crohn disease (HCC) 12/15/2014   Attention deficit hyperactivity disorder 12/15/2014   PCP:  Orland Mustard, MD Pharmacy:   [facility] [phone] [place] Sp[place], [place] - [address] AT [facility] [address] [place] [address] Phone: [phone] Fax: [phone]  [facility] [address] [place] [address] Phone: [phone] Fax: [phone]

## 2023-09-25 NOTE — Anesthesia Procedure Notes (Signed)
 Anesthesia Regional Block: Popliteal block   Pre-Anesthetic Checklist: , timeout performed,  Correct Patient, Correct Site, Correct Laterality,  Correct Procedure, Correct Position, site marked,  Risks and benefits discussed,  Pre-op evaluation,  At surgeon's request and post-op pain management  Laterality: Left  Prep: Maximum Sterile Barrier Precautions used, chloraprep       Needles:  Injection technique: Single-shot  Needle Type: Echogenic Stimulator Needle     Needle Length: 9cm  Needle Gauge: 21     Additional Needles:   Procedures:,,,, ultrasound used (permanent image in chart),,    Narrative:  Start time: 09/25/2023 7:08 AM End time: 09/25/2023 7:15 AM Injection made incrementally with aspirations every 5 mL.  Performed by: Personally  Anesthesiologist: Gaynelle Adu, MD

## 2023-09-25 NOTE — Brief Op Note (Signed)
 09/25/2023  11:26 AM  PATIENT:  Victor Lopez  Nov 25, 1965 male   MEDICAL RECORD NUMBER: [id number]  PREOPERATIVE DIAGNOSIS:   1. LEFT ACHILLES TENDON RUPTURE. 2. RIGHT KNEE EFFUSION  POSTOPERATIVE DIAGNOSIS:  1. LEFT ACHILLES TENDON RUPTURE. 2. STABLE RIGHT KNEE.  PROCEDURE: 1. Repair of left Achilles tendon. 2. Examination of right knee under anesthesia.  SURGEON:  Victor Galas, MD  ASSISTANT:   1.  Victor Presto, PA-C. 2.  PA student.   ANESTHESIA:  General, supplemented with regional block.  ESTIMATED BLOOD LOSS:  Minimal.  TOURNIQUET: None.  DRAINS:  None.  SPECIMENS:  None.  DISPOSITION:  To PACU.  CONDITION:  Stable.  BRIEF SUMMARY FOR PROCEDURE:  The patient is a 58 y.o. who sustained a complete Achilles tendon rupture in a skiing accident with polytrauma.  MRI confirmed location of the tear and significant retraction.  I did discussed with patient the risks and benefits of surgical versus nonsurgical management. The surgical risks discussed included nerve injury, wound problems/ infection, vessel injury, re-rupture, loss of motion, DVT, PE, and possible need for further surgery. After acknowledging these risks, the patient decided to proceed with surgical repair and provided consent for [place] to do so.  BRIEF SUMMARY OF PROCEDURE:  The patient was taken to the operating room after administration of regional block and antibiotics.  The patient was positioned prone, padding all prominences carefully and appropriately.  The operative lower extremity was prepped and draped in the usual sterile fashion using chlorhexidine wash Betadine scrub and paint.  A slightly medial posterior incision was made centered on the site of rupture, carrying dissection down to the tendon sheath, which was carefully protected and then incised longitudinally for later repair.  This exposed a  complete Achilles rupture with frayed ends.  I was cleaned the area with lavage.  There was not an  extensive amount of hematoma.  The ends were trimmed sharply with a scalpel. Using two limbs 1.3 mm Maxbraid (equivalent to #2 FiberWire), a Krakow stitch was passed distally and proximally and tied in the middle.  This was then circularized and reinforced with another running 0-PDS around the circumference of the repair, tying the knot on the deep side to reduce potential irritation. The wound was irrigated thoroughly.  The ankle was taken through range of motion without any gapping or concern of disruption of the repair.  To prevent any catching and to provide nourishment to the tendon, the paratenon was then repaired with 3-0 monocryl. Horizontal mattress sutures with 2-0 nylon were used for the skin.  A sterile gently compressive dressing and then a posterior and stirrup  splint applied with the ankle in slight flexion.    Victor Morita, PA-C, was present and assisted throughout was necessary for completing the suture repair of the Achilles at the appropriate tension and he also assisted with wound closure.  PROGNOSIS:  The patient will be nonweightbearing for the first 2 weeks though he may do touchdown.  We will then convert him into the Cam boot with heel lifts and progress with gentle weightbearing in the weeks to follow.  Sutures will be  removed in 10-14 days and a narcotic prescription has been written. No formal DVT prophylaxis but encouraged to take a baby Aspirin. He will use a platform walker because of his left distal radius fracture.

## 2023-09-25 NOTE — Progress Notes (Signed)
 Victor Morita, PA made aware that the patient's PCR swab resulted MRSA positive. Vancomycin ordered.

## 2023-09-25 NOTE — H&P (Signed)
 Orthopaedic Trauma Service H&P  Patient ID: Victor Lopez MRN: [id number] DOB/AGE: 11/24/1965 58 y.o.  Chief Complaint: polytrauma  HPI: Victor Lopez is an 58 y.o. male.wrecked skiing in [place] for [place] with left distal radius fracture, left fib neck fracture, left Achilles tendon rupture confirmed by MRI with retraction, and right knee effusion. No LOC, no tingling or numbness in lower extremities. D/o multiple procedures related to Crohns. Only taking NSAIDs.  Past Medical History:  Diagnosis Date   Crohn's disease [facility])     Past Surgical History:  Procedure Laterality Date   APPENDECTOMY     BOWEL RESECTION     x2 [phone]    History reviewed. No pertinent family history. Social History:  reports that he has quit smoking. He does not have any smokeless tobacco history on file. He reports that he does not drink alcohol and does not use drugs.  Allergies: No Known Allergies  Medications Prior to Admission  Medication Sig Dispense Refill   Cholecalciferol (VITAMIN D3) 125 MCG (5000 UT) CAPS Take 5,000 Units by mouth daily.     [employer] TRIPLE MAGNESIUM COMPLEX PO Take 1 tablet by mouth 2 (two) times daily.     fish oil-omega-3 fatty acids 1000 MG capsule Take 2 g by mouth 2 (two) times daily.     Multiple Vitamin (MULTIVITAMIN) tablet Take 1 tablet by mouth daily. Men's Multi     Omega-3 Fatty Acids (FISH OIL) 1000 MG CAPS Take 2,000 mg by mouth daily.     OVER THE COUNTER MEDICATION Take 2 capsules by mouth daily. Blue Green Algae     Turmeric Curcumin 500 MG CAPS Take 500 mg by mouth 2 (two) times daily.      Results for orders placed or performed during the hospital encounter of 09/25/23 (from the past 48 hours)  CBC per protocol     Status: None   Collection Time: 09/25/23  6:40 AM  Result Value Ref Range   WBC 6.1 4.0 - 10.5 K/uL   RBC 4.66 4.22 - 5.81 MIL/uL   Hemoglobin 14.4 13.0 - 17.0 g/dL   HCT 96.2 95.2 - 84.1 %   MCV  89.9 80.0 - 100.0 fL   MCH 30.9 26.0 - 34.0 pg   MCHC 34.4 30.0 - 36.0 g/dL   RDW 32.4 40.1 - 02.7 %   Platelets 220 150 - 400 K/uL    Comment: REPEATED TO VERIFY   nRBC 0.0 0.0 - 0.2 %    Comment: Performed at [facility], [address], [place], [place] [address]   No results found.  ROS .No recent fever, bleeding abnormalities, urologic dysfunction, GI problems, or weight gain.   Blood pressure 111/83, pulse 68, temperature 98.2 F (36.8 C), resp. rate 18, height 6' (1.829 m), weight 94 kg, SpO2 96%. Physical Exam Tender left wrist in splint Left Achilles palpable defect, intact sensory and motor, DP 2+  Assessment/Plan  Left Achilles rupture for repair today  The risks and benefits of left Achilles repair were discussed with the patient, including the possibility of infection, nerve injury, vessel injury, wound breakdown, arthritis, symptomatic hardware, DVT/ PE, loss of motion, malunion, nonunion, and need for further surgery among others.  We also specifically discussed the possible need to stage surgery because of the elevated risk of soft tissue breakdown that could lead to amputation.  These risks were acknowledged and consent was provided to proceed.    Myrene Galas, MD  [facility], [facility] [phone]  09/25/2023, 7:51 AM  [facility] [address] [place] [address] [phone] (F)

## 2023-09-26 ENCOUNTER — Encounter (HOSPITAL_COMMUNITY): Payer: Self-pay | Admitting: Orthopedic Surgery
